# Patient Record
Sex: Female | Born: 1945 | Race: White | Hispanic: No | Marital: Single | Smoking: Former smoker
Health system: Southern US, Community
[De-identification: ages and names within clinical notes are randomized; demographics above are authoritative.]

## PROBLEM LIST (undated history)

## (undated) DIAGNOSIS — I10 Essential (primary) hypertension: Secondary | ICD-10-CM

## (undated) DIAGNOSIS — J45909 Unspecified asthma, uncomplicated: Secondary | ICD-10-CM

## (undated) DIAGNOSIS — M359 Systemic involvement of connective tissue, unspecified: Secondary | ICD-10-CM

## (undated) HISTORY — PX: EYE SURGERY: SHX253

## (undated) HISTORY — PX: NOSE SURGERY: SHX723

## (undated) HISTORY — PX: ABDOMINAL HYSTERECTOMY: SHX81

---

## 2004-02-28 ENCOUNTER — Emergency Department: Payer: Self-pay | Admitting: Emergency Medicine

## 2004-03-17 ENCOUNTER — Ambulatory Visit: Payer: Self-pay | Admitting: Urology

## 2004-06-20 ENCOUNTER — Ambulatory Visit: Payer: Self-pay | Admitting: Internal Medicine

## 2014-08-27 DIAGNOSIS — Z8639 Personal history of other endocrine, nutritional and metabolic disease: Secondary | ICD-10-CM | POA: Insufficient documentation

## 2014-08-27 DIAGNOSIS — K5732 Diverticulitis of large intestine without perforation or abscess without bleeding: Secondary | ICD-10-CM | POA: Insufficient documentation

## 2014-08-27 DIAGNOSIS — I10 Essential (primary) hypertension: Secondary | ICD-10-CM | POA: Insufficient documentation

## 2014-08-27 DIAGNOSIS — J4520 Mild intermittent asthma, uncomplicated: Secondary | ICD-10-CM | POA: Insufficient documentation

## 2014-08-27 DIAGNOSIS — N200 Calculus of kidney: Secondary | ICD-10-CM | POA: Insufficient documentation

## 2014-08-27 DIAGNOSIS — M0609 Rheumatoid arthritis without rheumatoid factor, multiple sites: Secondary | ICD-10-CM | POA: Insufficient documentation

## 2014-08-28 ENCOUNTER — Other Ambulatory Visit: Payer: Self-pay | Admitting: Internal Medicine

## 2014-08-28 DIAGNOSIS — R102 Pelvic and perineal pain: Secondary | ICD-10-CM

## 2014-08-28 DIAGNOSIS — R1032 Left lower quadrant pain: Secondary | ICD-10-CM

## 2014-08-30 ENCOUNTER — Ambulatory Visit
Admission: RE | Admit: 2014-08-30 | Discharge: 2014-08-30 | Disposition: A | Discharge status: Home / Self Care | Payer: Commercial Managed Care - PPO | Source: Ambulatory Visit | Attending: Internal Medicine | Admitting: Internal Medicine

## 2014-08-30 DIAGNOSIS — N281 Cyst of kidney, acquired: Secondary | ICD-10-CM | POA: Diagnosis not present

## 2014-08-30 DIAGNOSIS — K5730 Diverticulosis of large intestine without perforation or abscess without bleeding: Secondary | ICD-10-CM | POA: Insufficient documentation

## 2014-08-30 DIAGNOSIS — R1032 Left lower quadrant pain: Secondary | ICD-10-CM | POA: Diagnosis present

## 2014-08-30 DIAGNOSIS — R102 Pelvic and perineal pain: Secondary | ICD-10-CM

## 2014-08-30 HISTORY — DX: Systemic involvement of connective tissue, unspecified: M35.9

## 2014-08-30 HISTORY — DX: Unspecified asthma, uncomplicated: J45.909

## 2014-08-30 HISTORY — DX: Essential (primary) hypertension: I10

## 2014-08-30 MED ORDER — IOHEXOL 300 MG/ML  SOLN
100.0000 mL | Freq: Once | INTRAMUSCULAR | Status: AC | PRN
  Administered 2014-08-30: 100 mL via INTRAVENOUS

## 2014-09-21 ENCOUNTER — Other Ambulatory Visit: Payer: Self-pay | Admitting: Internal Medicine

## 2014-09-21 DIAGNOSIS — Z1231 Encounter for screening mammogram for malignant neoplasm of breast: Secondary | ICD-10-CM

## 2014-09-26 ENCOUNTER — Ambulatory Visit
Admission: RE | Admit: 2014-09-26 | Discharge: 2014-09-26 | Disposition: A | Discharge status: Home / Self Care | Payer: Commercial Managed Care - PPO | Source: Ambulatory Visit | Attending: Internal Medicine | Admitting: Internal Medicine

## 2014-09-26 DIAGNOSIS — Z1231 Encounter for screening mammogram for malignant neoplasm of breast: Secondary | ICD-10-CM | POA: Diagnosis present

## 2015-05-01 DIAGNOSIS — E559 Vitamin D deficiency, unspecified: Secondary | ICD-10-CM | POA: Insufficient documentation

## 2015-09-02 ENCOUNTER — Other Ambulatory Visit: Payer: Self-pay | Admitting: Internal Medicine

## 2015-09-02 DIAGNOSIS — Z1231 Encounter for screening mammogram for malignant neoplasm of breast: Secondary | ICD-10-CM

## 2015-09-27 ENCOUNTER — Ambulatory Visit
Admission: RE | Admit: 2015-09-27 | Discharge: 2015-09-27 | Disposition: A | Discharge status: Home / Self Care | Payer: Medicare Other | Source: Ambulatory Visit | Attending: Internal Medicine | Admitting: Internal Medicine

## 2015-09-27 ENCOUNTER — Other Ambulatory Visit: Payer: Self-pay | Admitting: Internal Medicine

## 2015-09-27 DIAGNOSIS — Z1231 Encounter for screening mammogram for malignant neoplasm of breast: Secondary | ICD-10-CM | POA: Insufficient documentation

## 2016-08-18 ENCOUNTER — Other Ambulatory Visit: Payer: Self-pay | Admitting: Internal Medicine

## 2016-08-18 DIAGNOSIS — Z1231 Encounter for screening mammogram for malignant neoplasm of breast: Secondary | ICD-10-CM

## 2016-09-29 ENCOUNTER — Ambulatory Visit
Admission: RE | Admit: 2016-09-29 | Discharge: 2016-09-29 | Disposition: A | Discharge status: Home / Self Care | Payer: Medicare Other | Source: Ambulatory Visit | Attending: Internal Medicine | Admitting: Internal Medicine

## 2016-09-29 DIAGNOSIS — Z1231 Encounter for screening mammogram for malignant neoplasm of breast: Secondary | ICD-10-CM | POA: Insufficient documentation

## 2017-08-30 ENCOUNTER — Other Ambulatory Visit: Payer: Self-pay | Admitting: Internal Medicine

## 2017-08-30 DIAGNOSIS — Z1231 Encounter for screening mammogram for malignant neoplasm of breast: Secondary | ICD-10-CM

## 2017-09-30 ENCOUNTER — Ambulatory Visit
Admission: RE | Admit: 2017-09-30 | Discharge: 2017-09-30 | Disposition: A | Discharge status: Home / Self Care | Payer: Medicare Other | Source: Ambulatory Visit | Attending: Internal Medicine | Admitting: Internal Medicine

## 2017-09-30 DIAGNOSIS — Z1231 Encounter for screening mammogram for malignant neoplasm of breast: Secondary | ICD-10-CM | POA: Diagnosis present

## 2018-08-26 ENCOUNTER — Other Ambulatory Visit: Payer: Self-pay | Admitting: Internal Medicine

## 2018-08-26 DIAGNOSIS — Z1231 Encounter for screening mammogram for malignant neoplasm of breast: Secondary | ICD-10-CM

## 2018-10-03 ENCOUNTER — Ambulatory Visit
Admission: RE | Admit: 2018-10-03 | Discharge: 2018-10-03 | Disposition: A | Discharge status: Home / Self Care | Payer: Medicare Other | Source: Ambulatory Visit | Attending: Internal Medicine | Admitting: Internal Medicine

## 2018-10-03 DIAGNOSIS — Z1231 Encounter for screening mammogram for malignant neoplasm of breast: Secondary | ICD-10-CM | POA: Insufficient documentation

## 2019-02-17 ENCOUNTER — Ambulatory Visit: Payer: Commercial Managed Care - PPO | Attending: Internal Medicine

## 2019-02-17 DIAGNOSIS — Z23 Encounter for immunization: Secondary | ICD-10-CM | POA: Insufficient documentation

## 2019-02-17 NOTE — Progress Notes (Signed)
   Covid-19 Vaccination Clinic  Name:  Kathryn Hansen    MRN: [id number] DOB: 10/21/1945  02/17/2019  Kathryn Hansen was observed post Covid-19 immunization for 15 minutes without incidence. She was provided with Vaccine Information Sheet and instruction to access the V-Safe system.   Kathryn Hansen was instructed to call 911 with any severe reactions post vaccine: Marland Kitchen Difficulty breathing  . Swelling of your face and throat  . A fast heartbeat  . A bad rash all over your body  . Dizziness and weakness    Immunizations Administered    Name Date Dose VIS Date Route   Pfizer COVID-19 Vaccine 02/17/2019  8:55 AM 0.3 mL 12/16/2018 Intramuscular   Manufacturer: [place]   Lot: [id number]   [place]: [id number]

## 2019-03-14 ENCOUNTER — Ambulatory Visit: Payer: Commercial Managed Care - PPO | Attending: Internal Medicine

## 2019-03-14 DIAGNOSIS — Z23 Encounter for immunization: Secondary | ICD-10-CM | POA: Insufficient documentation

## 2019-03-14 NOTE — Progress Notes (Signed)
   Covid-19 Vaccination Clinic  Name:  Kathryn Hansen    MRN: [id number] DOB: 02-06-1945  03/14/2019  [name] was observed post Covid-19 immunization for 30 minutes without incident. She was provided with Vaccine Information Sheet and instruction to access the V-Safe system.   [name] was instructed to call 911 with any severe reactions post vaccine: Marland Kitchen Difficulty breathing  . Swelling of face and throat  . A fast heartbeat  . A bad rash all over body  . Dizziness and weakness   Immunizations Administered    Name Date Dose VIS Date Route   Pfizer COVID-19 Vaccine 03/14/2019  8:28 AM 0.3 mL 12/16/2018 Intramuscular   Manufacturer: [place]   Lot: [id number]   [place]: [id number]

## 2019-05-02 ENCOUNTER — Other Ambulatory Visit: Payer: Self-pay

## 2019-05-03 ENCOUNTER — Telehealth (INDEPENDENT_AMBULATORY_CARE_PROVIDER_SITE_OTHER): Payer: Self-pay | Admitting: Gastroenterology

## 2019-05-03 ENCOUNTER — Other Ambulatory Visit: Payer: Self-pay

## 2019-05-03 DIAGNOSIS — Z1211 Encounter for screening for malignant neoplasm of colon: Secondary | ICD-10-CM

## 2019-05-03 NOTE — Progress Notes (Signed)
Gastroenterology Pre-Procedure Review  Request Date: Tuesday 06/06/19 Requesting Physician: Dr. Allen Norris  PATIENT REVIEW QUESTIONS: The patient responded to the following health history questions as indicated:    1. Are you having any GI issues? no 2. Do you have a personal history of Polyps? yes (10 years ago.  Colonscopy was done in [place] or [place]) 3. Do you have a family history of Colon Cancer or Polyps? no 4. Diabetes Mellitus? no 5. Joint replacements in the past 12 months?no 6. Major health problems in the past 3 months?no 7. Any artificial heart valves, MVP, or defibrillator?no    MEDICATIONS & ALLERGIES:    Patient reports the following regarding taking any anticoagulation/antiplatelet therapy:   Plavix, Coumadin, Eliquis, Xarelto, Lovenox, Pradaxa, Brilinta, or Effient? no Aspirin? no  Patient confirms/reports the following medications:  Current Outpatient Medications  Medication Sig Dispense Refill  . albuterol (VENTOLIN HFA) 108 (90 Base) MCG/ACT inhaler INHALE 2 INHALATIONS INTO THE LUNGS EVERY 6 HOURS AS NEEDED FOR WHEEZING    . allopurinol (ZYLOPRIM) 100 MG tablet Take 100 mg by mouth daily.    Marland Kitchen aspirin 81 MG EC tablet Take by mouth.    . Fluticasone-Salmeterol (ADVAIR) 250-50 MCG/DOSE AEPB Inhale into the lungs.    . hydrochlorothiazide (HYDRODIURIL) 25 MG tablet Take 25 mg by mouth daily.    . naproxen (NAPROSYN) 250 MG tablet Take by mouth.    . Cholecalciferol 25 MCG (1000 UT) tablet Take by mouth.    Grant Ruts INHUB 250-50 MCG/DOSE AEPB INHALE 1 INHALATION INTO THE LUNGS EVERY 12 HOURS     No current facility-administered medications for this visit.    Patient confirms/reports the following allergies:  Allergies  Allergen Reactions  . Codeine Anaphylaxis and Other (See Comments)    Pt states it made her feel like she was dying;    . Other Anaphylaxis  . Azithromycin Nausea Only  . Cefdinir Other (See Comments)  . Erythromycin Nausea Only  . Levofloxacin  Other (See Comments)    Pt states it made her feel drunk     No orders of the defined types were placed in this encounter.   AUTHORIZATION INFORMATION Primary Insurance: 1D#: Group #:  Secondary Insurance: 1D#: Group #:  SCHEDULE INFORMATION: Date: 06/06/19 Time: Location:[facility]

## 2019-07-26 ENCOUNTER — Other Ambulatory Visit: Payer: Self-pay | Admitting: Internal Medicine

## 2019-07-26 DIAGNOSIS — Z1231 Encounter for screening mammogram for malignant neoplasm of breast: Secondary | ICD-10-CM

## 2019-08-25 ENCOUNTER — Other Ambulatory Visit
Admission: RE | Admit: 2019-08-25 | Discharge: 2019-08-25 | Disposition: A | Discharge status: Home / Self Care | Payer: Medicare Other | Source: Ambulatory Visit | Attending: Gastroenterology | Admitting: Gastroenterology

## 2019-08-25 ENCOUNTER — Other Ambulatory Visit: Payer: Self-pay

## 2019-08-25 DIAGNOSIS — Z01812 Encounter for preprocedural laboratory examination: Secondary | ICD-10-CM | POA: Diagnosis present

## 2019-08-25 DIAGNOSIS — Z20822 Contact with and (suspected) exposure to covid-19: Secondary | ICD-10-CM | POA: Insufficient documentation

## 2019-08-25 LAB — SARS CORONAVIRUS 2 (TAT 6-24 HRS): SARS Coronavirus 2: NEGATIVE

## 2019-08-26 IMAGING — MG MM DIGITAL SCREENING BILAT W/ TOMO W/ CAD
6 of 10 series · 6 of 30 positions shown · non-contrast
Comparison: Previous exam(s).

CLINICAL DATA: Screening.

EXAM:
DIGITAL SCREENING BILATERAL MAMMOGRAM WITH TOMO AND CAD

[R MLO synth-2D (1 of 2)]
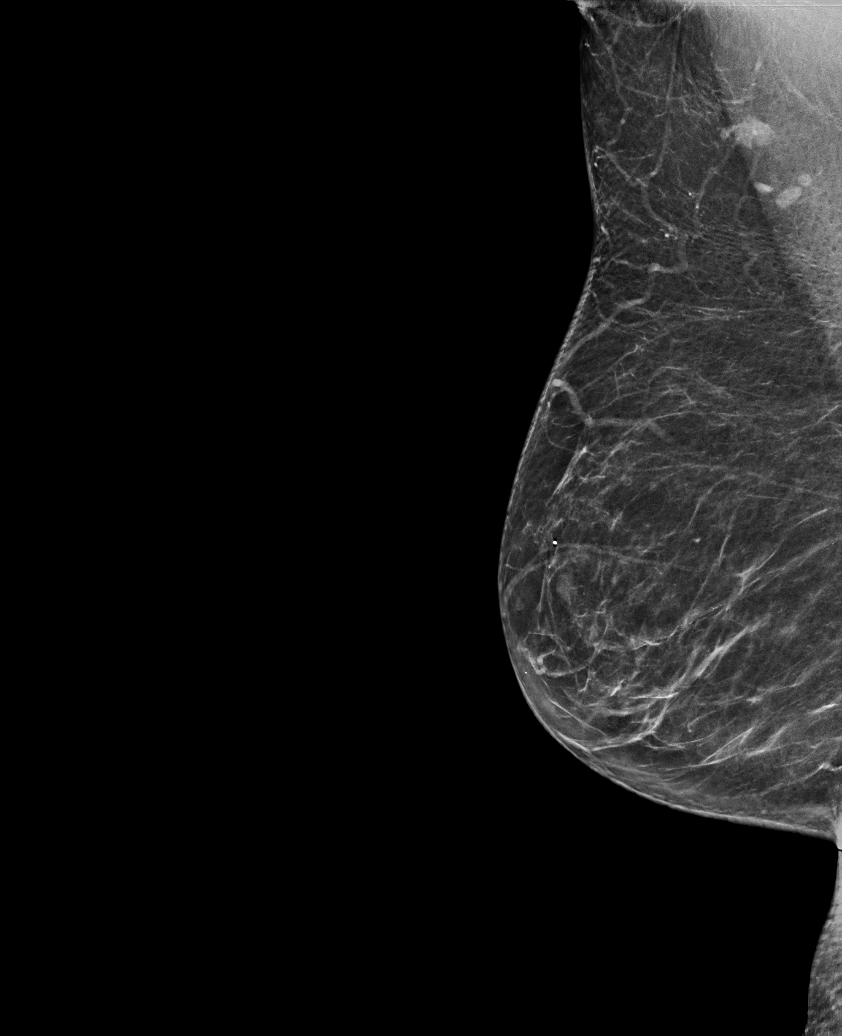

[R CC synth-2D]
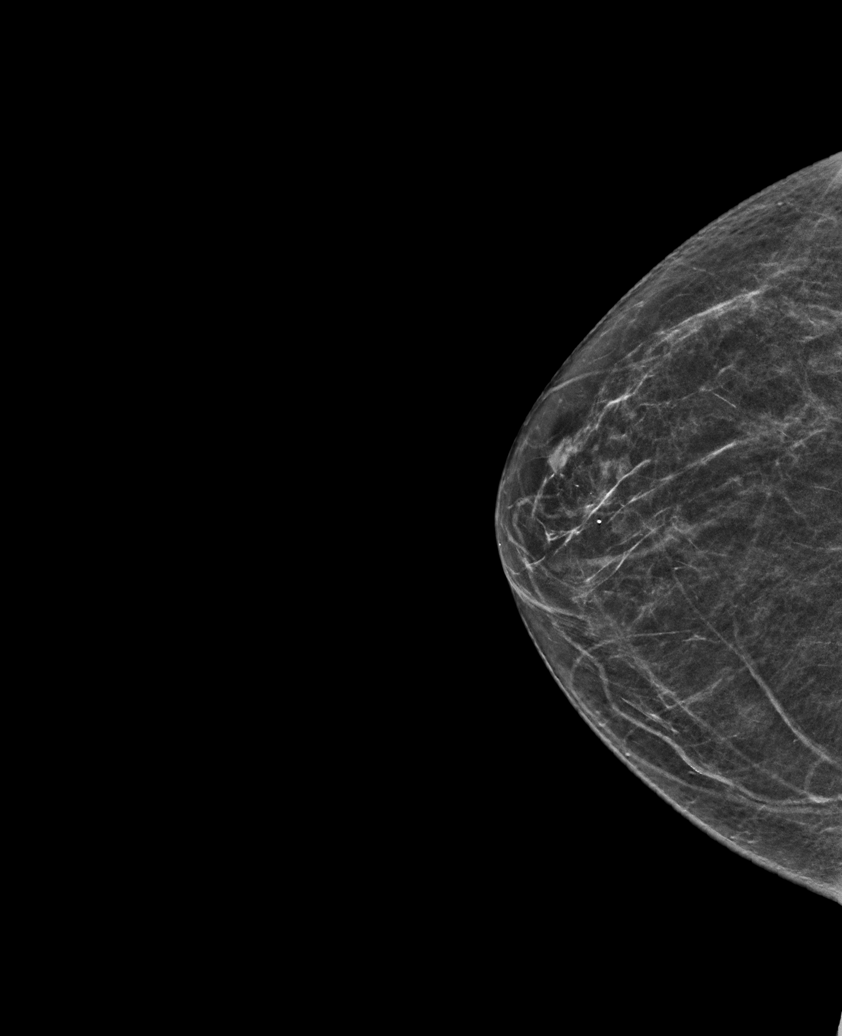

[L CC synth-2D]
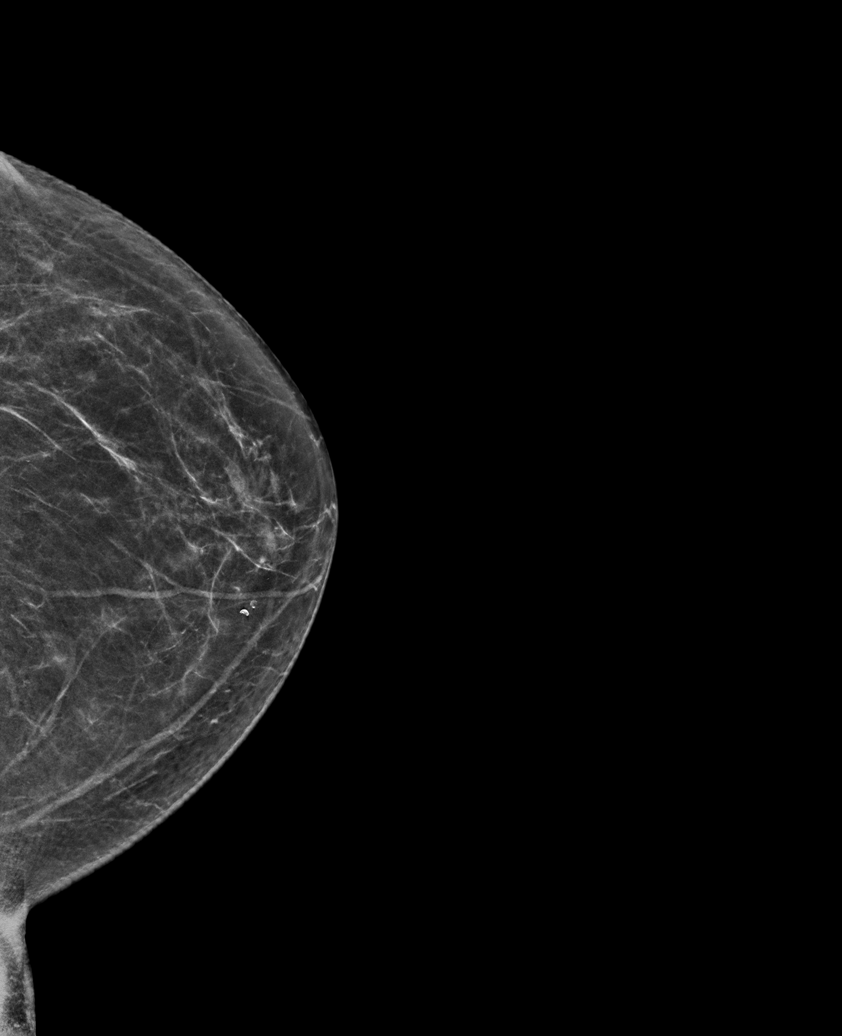

[L MLO synth-2D]
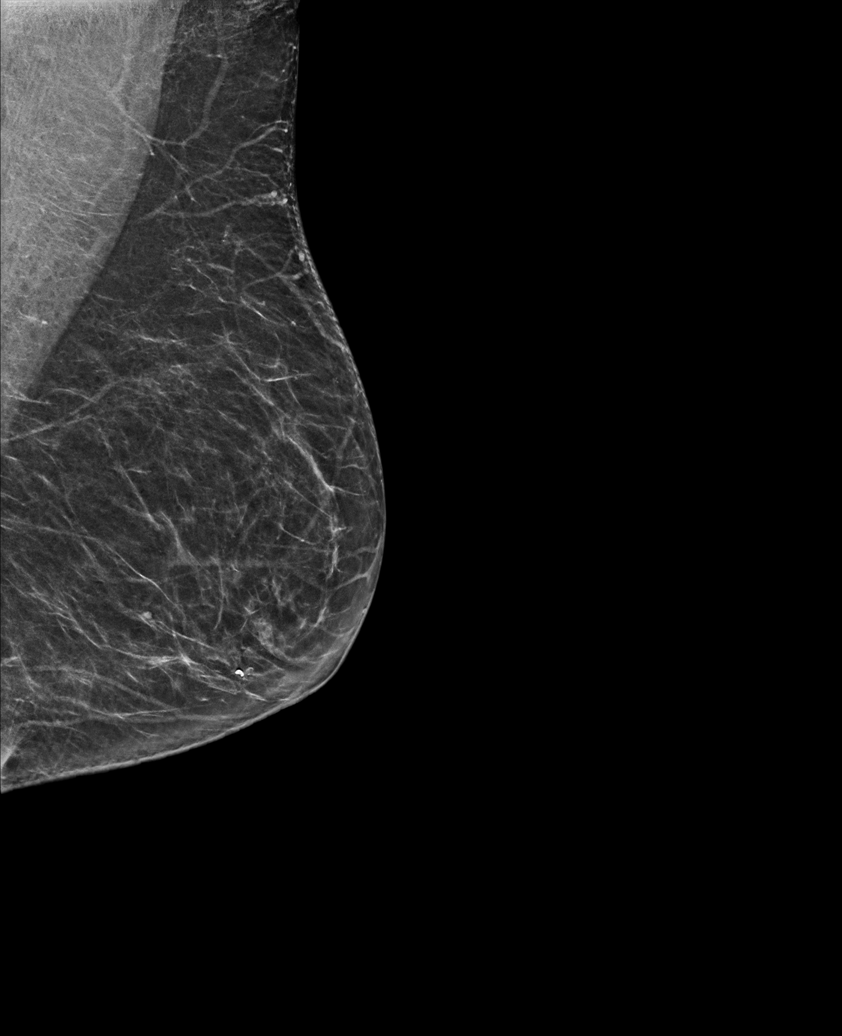

[R MLO synth-2D (2 of 2)]
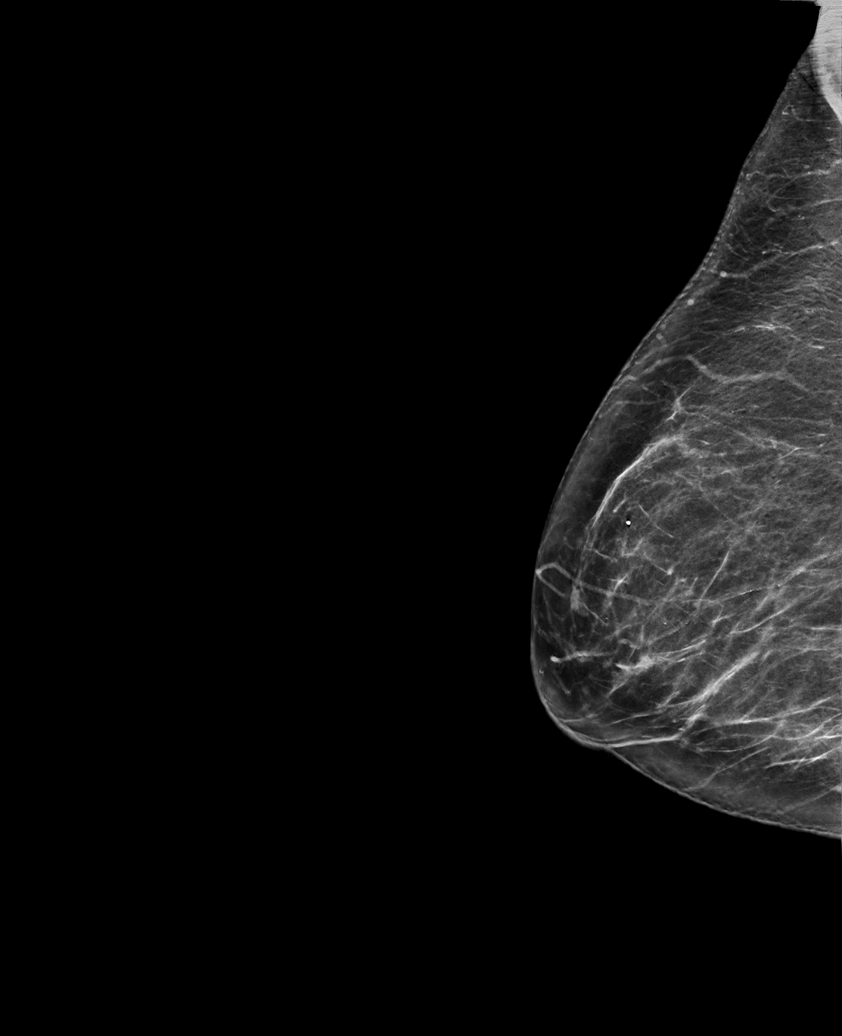

[R MLO tomo · tomo slice 36/71.0]
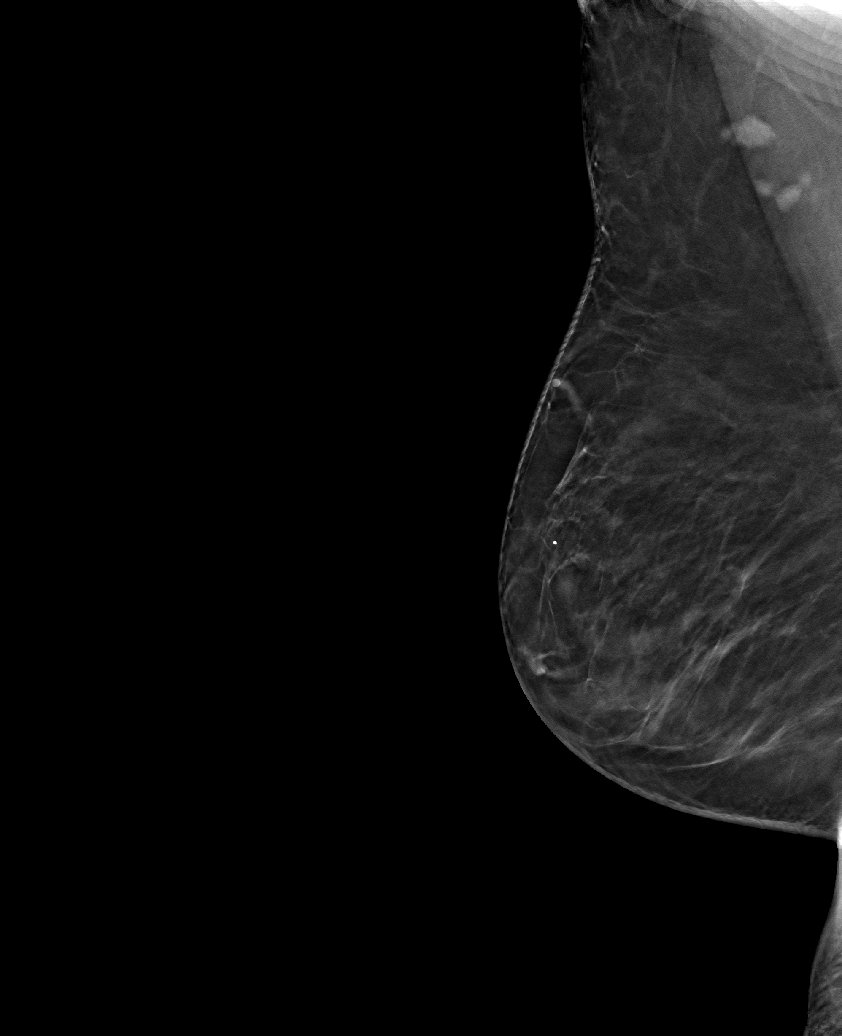

[6 of 30 positions shown; findings below may reference images not displayed]

ACR Breast Density Category b: There are scattered areas of
fibroglandular density.
FINDINGS: There are no findings suspicious for malignancy. Images were
processed with CAD.
IMPRESSION: No mammographic evidence of malignancy. A result letter of this
screening mammogram will be mailed directly to the patient.

RECOMMENDATION:
Screening mammogram in one year. (Code:[id number])

BI-RADS CATEGORY  1: Negative.

## 2019-08-29 ENCOUNTER — Ambulatory Visit: Payer: Medicare Other | Admitting: Certified Registered Nurse Anesthetist

## 2019-08-29 ENCOUNTER — Other Ambulatory Visit: Payer: Self-pay

## 2019-08-29 ENCOUNTER — Encounter: Payer: Self-pay | Admitting: Gastroenterology

## 2019-08-29 ENCOUNTER — Ambulatory Visit
Admission: RE | Admit: 2019-08-29 | Discharge: 2019-08-29 | Disposition: A | Discharge status: Home / Self Care | Payer: Medicare Other | Attending: Gastroenterology | Admitting: Gastroenterology

## 2019-08-29 ENCOUNTER — Encounter
Admission: RE | Disposition: A | Discharge status: Home / Self Care | Payer: Self-pay | Source: Home / Self Care | Attending: Gastroenterology

## 2019-08-29 DIAGNOSIS — Z79899 Other long term (current) drug therapy: Secondary | ICD-10-CM | POA: Diagnosis not present

## 2019-08-29 DIAGNOSIS — M359 Systemic involvement of connective tissue, unspecified: Secondary | ICD-10-CM | POA: Insufficient documentation

## 2019-08-29 DIAGNOSIS — K635 Polyp of colon: Secondary | ICD-10-CM | POA: Diagnosis not present

## 2019-08-29 DIAGNOSIS — Z1211 Encounter for screening for malignant neoplasm of colon: Secondary | ICD-10-CM

## 2019-08-29 DIAGNOSIS — J449 Chronic obstructive pulmonary disease, unspecified: Secondary | ICD-10-CM | POA: Insufficient documentation

## 2019-08-29 DIAGNOSIS — K640 First degree hemorrhoids: Secondary | ICD-10-CM | POA: Insufficient documentation

## 2019-08-29 DIAGNOSIS — Z90710 Acquired absence of both cervix and uterus: Secondary | ICD-10-CM | POA: Insufficient documentation

## 2019-08-29 DIAGNOSIS — K5730 Diverticulosis of large intestine without perforation or abscess without bleeding: Secondary | ICD-10-CM | POA: Insufficient documentation

## 2019-08-29 DIAGNOSIS — Z7951 Long term (current) use of inhaled steroids: Secondary | ICD-10-CM | POA: Insufficient documentation

## 2019-08-29 DIAGNOSIS — Z885 Allergy status to narcotic agent status: Secondary | ICD-10-CM | POA: Diagnosis not present

## 2019-08-29 DIAGNOSIS — Z7982 Long term (current) use of aspirin: Secondary | ICD-10-CM | POA: Insufficient documentation

## 2019-08-29 DIAGNOSIS — D123 Benign neoplasm of transverse colon: Secondary | ICD-10-CM | POA: Diagnosis not present

## 2019-08-29 DIAGNOSIS — I10 Essential (primary) hypertension: Secondary | ICD-10-CM | POA: Insufficient documentation

## 2019-08-29 DIAGNOSIS — Z87891 Personal history of nicotine dependence: Secondary | ICD-10-CM | POA: Insufficient documentation

## 2019-08-29 DIAGNOSIS — Z87442 Personal history of urinary calculi: Secondary | ICD-10-CM | POA: Diagnosis not present

## 2019-08-29 DIAGNOSIS — Z881 Allergy status to other antibiotic agents status: Secondary | ICD-10-CM | POA: Insufficient documentation

## 2019-08-29 DIAGNOSIS — Z888 Allergy status to other drugs, medicaments and biological substances status: Secondary | ICD-10-CM | POA: Diagnosis not present

## 2019-08-29 HISTORY — PX: COLONOSCOPY WITH PROPOFOL: SHX5780

## 2019-08-29 SURGERY — COLONOSCOPY WITH PROPOFOL
Anesthesia: General

## 2019-08-29 MED ORDER — PROPOFOL 500 MG/50ML IV EMUL
INTRAVENOUS | Status: AC
  Filled 2019-08-29: qty 50

## 2019-08-29 MED ORDER — PROPOFOL 500 MG/50ML IV EMUL
INTRAVENOUS | Status: DC | PRN
  Administered 2019-08-29: 130 ug/kg/min via INTRAVENOUS

## 2019-08-29 MED ORDER — LIDOCAINE HCL (CARDIAC) PF 100 MG/5ML IV SOSY
PREFILLED_SYRINGE | INTRAVENOUS | Status: DC | PRN
  Administered 2019-08-29: 50 mg via INTRAVENOUS

## 2019-08-29 MED ORDER — LIDOCAINE HCL (PF) 2 % IJ SOLN
INTRAMUSCULAR | Status: AC
  Filled 2019-08-29: qty 5

## 2019-08-29 MED ORDER — SODIUM CHLORIDE 0.9 % IV SOLN
INTRAVENOUS | Status: DC
  Administered 2019-08-29: 1000 mL via INTRAVENOUS

## 2019-08-29 MED ORDER — PROPOFOL 10 MG/ML IV BOLUS
INTRAVENOUS | Status: DC | PRN
  Administered 2019-08-29: 80 mg via INTRAVENOUS
  Administered 2019-08-29: 19 mg via INTRAVENOUS

## 2019-08-29 MED ORDER — PHENYLEPHRINE HCL (PRESSORS) 10 MG/ML IV SOLN
INTRAVENOUS | Status: DC | PRN
  Administered 2019-08-29 (×2): 100 ug via INTRAVENOUS

## 2019-08-29 NOTE — Anesthesia Preprocedure Evaluation (Signed)
Anesthesia Evaluation  Patient identified by MRN, date of birth, ID band Patient awake    Reviewed: Allergy & Precautions, H&P , NPO status , Patient's Chart, lab work & pertinent test results, reviewed documented beta blocker date and time   History of Anesthesia Complications Negative for: history of anesthetic complications  Airway Mallampati: I  TM Distance: >3 FB Neck ROM: full    Dental  (+) Dental Advidsory Given, Caps, Missing, Teeth Intact   Pulmonary neg shortness of breath, asthma , neg sleep apnea, COPD, neg recent URI, former smoker,    Pulmonary exam normal breath sounds clear to auscultation       Cardiovascular Exercise Tolerance: Good hypertension, (-) angina(-) Past MI and (-) Cardiac Stents Normal cardiovascular exam(-) dysrhythmias (-) Valvular Problems/Murmurs Rhythm:regular Rate:Normal     Neuro/Psych negative neurological ROS  negative psych ROS   GI/Hepatic negative GI ROS, Neg liver ROS,   Endo/Other  negative endocrine ROS  Renal/GU Renal disease (kidney stones)  negative genitourinary   Musculoskeletal   Abdominal   Peds  Hematology negative hematology ROS (+)   Anesthesia Other Findings Past Medical History: No date: Asthma No date: Collagen vascular disease (HCC) No date: Hypertension   Reproductive/Obstetrics negative OB ROS                             Anesthesia Physical Anesthesia Plan  ASA: III  Anesthesia Plan: General   Post-op Pain Management:    Induction: Intravenous  PONV Risk Score and Plan: 3 and Propofol infusion and TIVA  Airway Management Planned: Natural Airway and Nasal Cannula  Additional Equipment:   Intra-op Plan:   Post-operative Plan:   Informed Consent: I have reviewed the patients History and Physical, chart, labs and discussed the procedure including the risks, benefits and alternatives for the proposed anesthesia with  the patient or authorized representative who has indicated his/her understanding and acceptance.     Dental Advisory Given  Plan Discussed with: Anesthesiologist, CRNA and Surgeon  Anesthesia Plan Comments:         Anesthesia Quick Evaluation

## 2019-08-29 NOTE — Anesthesia Postprocedure Evaluation (Signed)
Anesthesia Post Note  Patient: Kathryn Hansen  Procedure(s) Performed: COLONOSCOPY WITH PROPOFOL (N/A )  Patient location during evaluation: Endoscopy Anesthesia Type: General Level of consciousness: awake and alert Pain management: pain level controlled Vital Signs Assessment: post-procedure vital signs reviewed and stable Respiratory status: spontaneous breathing, nonlabored ventilation, respiratory function stable and patient connected to nasal cannula oxygen Cardiovascular status: blood pressure returned to baseline and stable Postop Assessment: no apparent nausea or vomiting Anesthetic complications: no   No complications documented.   Last Vitals:  Vitals:   08/29/19 0839 08/29/19 0849  BP: 126/75 129/67  Pulse: 81 76  Resp: 15 18  Temp:    SpO2: 97% 100%    Last Pain:  Vitals:   08/29/19 0859  TempSrc:   PainSc: 0-No pain                 Martha Clan

## 2019-08-29 NOTE — Op Note (Signed)
[facility] Patient Name: Kathryn Hansen Procedure Date: 08/29/2019 7:08 AM MRN: [id number] Account #: [id number] Date of Birth: 01/04/1946 Admit Type: Outpatient Age: 74 Room: [facility] ENDO ROOM 4 Gender: Female Note Status: Finalized Procedure:             Colonoscopy Indications:           Screening for colorectal malignant neoplasm Providers:             [name] MD, MD Referring MD:          Leonie [facility]. Doy Hutching, MD (Referring MD) Medicines:             Propofol per Anesthesia Complications:         No immediate complications. Procedure:             Pre-Anesthesia Assessment:                        - Prior to the procedure, a History and Physical was                         performed, and patient medications and allergies were                         reviewed. The patient's tolerance of previous                         anesthesia was also reviewed. The risks and benefits                         of the procedure and the sedation options and risks                         were discussed with the patient. All questions were                         answered, and informed consent was obtained. Prior                         Anticoagulants: The patient has taken no previous                         anticoagulant or antiplatelet agents. ASA Grade                         Assessment: II - A patient with mild systemic disease.                         After reviewing the risks and benefits, the patient                         was deemed in satisfactory condition to undergo the                         procedure.                        After obtaining informed consent, the colonoscope was  passed under direct vision. Throughout the procedure,                         the patient's blood pressure, pulse, and oxygen                         saturations were monitored continuously. The                         Colonoscope was introduced through  the anus and                         advanced to the the cecum, identified by appendiceal                         orifice and ileocecal valve. The colonoscopy was                         performed without difficulty. The patient tolerated                         the procedure well. The quality of the bowel                         preparation was excellent. Findings:      The perianal and digital rectal examinations were normal.      A 15 mm polyp was found in the proximal transverse colon. The polyp was       sessile. Preparations were made for mucosal resection. Chromoscopy with       indigo carmine was done to mark the borders of the lesion. Saline with       indigo carmine was injected to raise the lesion and see the boarders.       Snare mucosal resection was performed. Resection and retrieval were       complete. To close a defect after polypectomy, three hemostatic clips       were successfully placed (MR conditional). There was no bleeding at the       end of the procedure.      Multiple small-mouthed diverticula were found in the sigmoid colon.      Non-bleeding internal hemorrhoids were found during retroflexion. The       hemorrhoids were Grade I (internal hemorrhoids that do not prolapse). Impression:            - One 15 mm polyp in the proximal transverse colon,                         removed with mucosal resection. Resected and                         retrieved. Clips (MR conditional) were placed.                        - Diverticulosis in the sigmoid colon.                        - Non-bleeding internal hemorrhoids.                        - Mucosal  resection was performed. Resection and                         retrieval were complete. Recommendation:        - Discharge patient to home.                        - Resume previous diet.                        - Continue present medications.                        - Await pathology results. Procedure Code(s):     ---  Professional ---                        [phone], Colonoscopy, flexible; with endoscopic mucosal                         resection Diagnosis Code(s):     --- Professional ---                        Z12.11, Encounter for screening for malignant neoplasm                         of colon                        K63.5, Polyp of colon CPT copyright 2019 American Medical Association. All rights reserved. The codes documented in this report are preliminary and upon coder review may  be revised to meet current compliance requirements. [name] MD, MD 08/29/2019 8:26:21 AM This report has been signed electronically. Number of Addenda: 0 Note Initiated On: 08/29/2019 7:08 AM Scope Withdrawal Time: 0 hours 21 minutes 28 seconds  Total Procedure Duration: 0 hours 27 minutes 12 seconds  Estimated Blood Loss:  Estimated blood loss: none.      [facility]

## 2019-08-29 NOTE — H&P (Signed)
Kathryn Lame, MD [address] [place], [place] [address] Phone: [phone] Fax : [phone]  Primary Care Physician:  Idelle Crouch, MD Primary Gastroenterologist:  Dr. Allen Norris  Pre-Procedure History & Physical: HPI:  Kathryn Hansen is a 74 y.o. female is here for a screening colonoscopy.   Past Medical History:  Diagnosis Date  . Asthma   . Collagen vascular disease ([place])   . Hypertension     Past Surgical History:  Procedure Laterality Date  . ABDOMINAL HYSTERECTOMY    . EYE SURGERY    . NOSE SURGERY      Prior to Admission medications   Medication Sig Start Date End Date Taking? Authorizing Provider  allopurinol (ZYLOPRIM) 100 MG tablet Take 100 mg by mouth daily. 02/15/19  Yes [provider]  aspirin 81 MG EC tablet Take by mouth.   Yes [provider]  Cholecalciferol 25 MCG (1000 UT) tablet Take by mouth.   Yes [provider]  Fluticasone-Salmeterol (ADVAIR) 250-50 MCG/DOSE AEPB Inhale into the lungs. 10/27/18 10/27/19 Yes [provider]  hydrochlorothiazide (HYDRODIURIL) 25 MG tablet Take 25 mg by mouth daily. 03/24/19  Yes [provider]  Grant Ruts INHUB 250-50 MCG/DOSE AEPB INHALE 1 INHALATION INTO THE LUNGS EVERY 12 HOURS 02/10/19  Yes [provider]  albuterol (VENTOLIN HFA) 108 (90 Base) MCG/ACT inhaler INHALE 2 INHALATIONS INTO THE LUNGS EVERY 6 HOURS AS NEEDED FOR WHEEZING 02/15/19   [provider]  naproxen (NAPROSYN) 250 MG tablet Take by mouth. 10/27/18   [provider]    Allergies as of 05/03/2019 - Review Complete 05/03/2019  Allergen Reaction Noted  . Codeine Anaphylaxis and Other (See Comments) 09/27/2013  . Other Anaphylaxis 08/27/2014  . Azithromycin Nausea Only 08/27/2014  . Cefdinir Other (See Comments) 08/27/2014  . Erythromycin Nausea Only 08/27/2014  . Levofloxacin Other (See Comments) 09/27/2013    Family History  Problem Relation Age of Onset  .  Breast cancer Neg Hx     Social History   Socioeconomic History  . Marital status: Single    Spouse name: Not on file  . Number of children: Not on file  . Years of education: Not on file  . Highest education level: Not on file  Occupational History  . Not on file  Tobacco Use  . Smoking status: Former Research scientist (life sciences)  . Smokeless tobacco: Never Used  Vaping Use  . Vaping Use: Never used  Substance and Sexual Activity  . Alcohol use: Not Currently  . Drug use: Never  . Sexual activity: Not on file  Other Topics Concern  . Not on file  Social History Narrative  . Not on file   Social Determinants of Health   Financial Resource Strain:   . Difficulty of Paying Living Expenses: Not on file  Food Insecurity:   . Worried About Charity fundraiser in the Last Year: Not on file  . Ran Out of Food in the Last Year: Not on file  Transportation Needs:   . Lack of Transportation (Medical): Not on file  . Lack of Transportation (Non-Medical): Not on file  Physical Activity:   . Days of Exercise per Week: Not on file  . Minutes of Exercise per Session: Not on file  Stress:   . Feeling of Stress : Not on file  Social Connections:   . Frequency of Communication with Friends and Family: Not on file  . Frequency of Social Gatherings with Friends and Family: Not  on file  . Attends Religious Services: Not on file  . Active Member of Clubs or Organizations: Not on file  . Attends Archivist Meetings: Not on file  . Marital Status: Not on file  Intimate Partner Violence:   . Fear of Current or Ex-Partner: Not on file  . Emotionally Abused: Not on file  . Physically Abused: Not on file  . Sexually Abused: Not on file    Review of Systems: See HPI, otherwise negative ROS  Physical Exam: BP (!) 152/81   Pulse 99   Temp 97.9 F (36.6 C) (Temporal)   Resp 20   Ht 5\' 6"  (1.676 m)   Wt 75.4 kg   SpO2 94%   BMI 26.85 kg/m  General:   Alert,  pleasant and cooperative in  NAD Head:  Normocephalic and atraumatic. Neck:  Supple; no masses or thyromegaly. Lungs:  Clear throughout to auscultation.    Heart:  Regular rate and rhythm. Abdomen:  Soft, nontender and nondistended. Normal bowel sounds, without guarding, and without rebound.   Neurologic:  Alert and  oriented x4;  grossly normal neurologically.  Impression/Plan: Kathryn Hansen is now here to undergo a screening colonoscopy.  Risks, benefits, and alternatives regarding colonoscopy have been reviewed with the patient.  Questions have been answered.  All parties agreeable.

## 2019-08-29 NOTE — Transfer of Care (Signed)
Immediate Anesthesia Transfer of Care Note  Patient: Kathryn Hansen  Procedure(s) Performed: COLONOSCOPY WITH PROPOFOL (N/A )  Patient Location: PACU  Anesthesia Type:General  Level of Consciousness: drowsy  Airway & Oxygen Therapy: Patient Spontanous Breathing and Patient connected to nasal cannula oxygen  Post-op Assessment: Report given to RN and Post -op Vital signs reviewed and stable  Post vital signs: Reviewed and stable  Last Vitals:  Vitals Value Taken Time  BP 97/52 08/29/19 0828  Temp    Pulse 67 08/29/19 0829  Resp 12 08/29/19 0829  SpO2 93 % 08/29/19 0829  Vitals shown include unvalidated device data.  Last Pain:  Vitals:   08/29/19 0709  TempSrc: Temporal  PainSc: 0-No pain         Complications: No complications documented.

## 2019-08-30 ENCOUNTER — Encounter: Payer: Self-pay | Admitting: Gastroenterology

## 2019-08-30 LAB — SURGICAL PATHOLOGY

## 2019-08-30 NOTE — Progress Notes (Signed)
   08/29/19 0755  Clinical Encounter Type  Visited With Family  Visit Type Initial  Referral From Chaplain  Consult/Referral To Chaplain  While rounding SDS, chaplain briefly spoke with patient's friend and he said he was fine. He told chaplain to be blessed.

## 2019-08-31 ENCOUNTER — Encounter: Payer: Self-pay | Admitting: Gastroenterology

## 2019-10-04 ENCOUNTER — Ambulatory Visit
Admission: RE | Admit: 2019-10-04 | Discharge: 2019-10-04 | Disposition: A | Discharge status: Home / Self Care | Payer: Medicare Other | Source: Ambulatory Visit | Attending: Internal Medicine | Admitting: Internal Medicine

## 2019-10-04 DIAGNOSIS — Z1231 Encounter for screening mammogram for malignant neoplasm of breast: Secondary | ICD-10-CM | POA: Diagnosis not present

## 2020-05-09 ENCOUNTER — Other Ambulatory Visit: Payer: Self-pay | Admitting: Internal Medicine

## 2020-05-09 DIAGNOSIS — Z1231 Encounter for screening mammogram for malignant neoplasm of breast: Secondary | ICD-10-CM

## 2020-07-22 ENCOUNTER — Telehealth (INDEPENDENT_AMBULATORY_CARE_PROVIDER_SITE_OTHER): Payer: Self-pay | Admitting: Gastroenterology

## 2020-07-22 ENCOUNTER — Other Ambulatory Visit: Payer: Self-pay

## 2020-07-22 DIAGNOSIS — Z86010 Personal history of colonic polyps: Secondary | ICD-10-CM

## 2020-07-22 MED ORDER — NA SULFATE-K SULFATE-MG SULF 17.5-3.13-1.6 GM/177ML PO SOLN: 1.0000 | Freq: Once | ORAL | 0 refills | Status: AC

## 2020-07-22 NOTE — Progress Notes (Signed)
Gastroenterology Pre-Procedure Review  Request Date: 08/15/20 Requesting Physician: Dr. Allen Norris  PATIENT REVIEW QUESTIONS: The patient responded to the following health history questions as indicated:    1. Are you having any GI issues? no 2. Do you have a personal history of Polyps? yes (08/29/2019) 3. Do you have a family history of Colon Cancer or Polyps? yes (Aunt colon cancer) 4. Diabetes Mellitus? no 5. Joint replacements in the past 12 months?no 6. Major health problems in the past 3 months?no 7. Any artificial heart valves, MVP, or defibrillator?no    MEDICATIONS & ALLERGIES:    Patient reports the following regarding taking any anticoagulation/antiplatelet therapy:   Plavix, Coumadin, Eliquis, Xarelto, Lovenox, Pradaxa, Brilinta, or Effient? no Aspirin? yes (81 mg)  Patient confirms/reports the following medications:  Current Outpatient Medications  Medication Sig Dispense Refill   albuterol (VENTOLIN HFA) 108 (90 Base) MCG/ACT inhaler INHALE 2 INHALATIONS INTO THE LUNGS EVERY 6 HOURS AS NEEDED FOR WHEEZING     allopurinol (ZYLOPRIM) 100 MG tablet Take 100 mg by mouth daily.     aspirin 81 MG EC tablet Take by mouth.     Cholecalciferol 25 MCG (1000 UT) tablet Take by mouth.     Fluticasone-Salmeterol (ADVAIR) 250-50 MCG/DOSE AEPB Inhale into the lungs.     hydrochlorothiazide (HYDRODIURIL) 25 MG tablet Take 25 mg by mouth daily.     naproxen (NAPROSYN) 250 MG tablet Take by mouth.     WIXELA INHUB 250-50 MCG/DOSE AEPB INHALE 1 INHALATION INTO THE LUNGS EVERY 12 HOURS     No current facility-administered medications for this visit.    Patient confirms/reports the following allergies:  Allergies  Allergen Reactions   Codeine Anaphylaxis and Other (See Comments)    Pt states it made her feel like she was dying;     Other Anaphylaxis   Azithromycin Nausea Only   Cefdinir Other (See Comments)   Erythromycin Nausea Only   Levofloxacin Other (See Comments)    Pt states  it made her feel drunk     No orders of the defined types were placed in this encounter.   AUTHORIZATION INFORMATION Primary Insurance: 1D#: Group #:  Secondary Insurance: 1D#: Group #:  SCHEDULE INFORMATION: Date: 08/15/20 Time: Location: [facility]

## 2020-08-07 ENCOUNTER — Encounter: Payer: Self-pay | Admitting: Gastroenterology

## 2020-08-15 ENCOUNTER — Ambulatory Visit: Payer: Medicare Other | Admitting: Anesthesiology

## 2020-08-15 ENCOUNTER — Other Ambulatory Visit: Payer: Self-pay

## 2020-08-15 ENCOUNTER — Encounter: Payer: Self-pay | Admitting: Gastroenterology

## 2020-08-15 ENCOUNTER — Ambulatory Visit
Admission: RE | Admit: 2020-08-15 | Discharge: 2020-08-15 | Disposition: A | Discharge status: Home / Self Care | Payer: Medicare Other | Attending: Gastroenterology | Admitting: Gastroenterology

## 2020-08-15 ENCOUNTER — Encounter
Admission: RE | Disposition: A | Discharge status: Home / Self Care | Payer: Self-pay | Source: Home / Self Care | Attending: Gastroenterology

## 2020-08-15 DIAGNOSIS — Z90710 Acquired absence of both cervix and uterus: Secondary | ICD-10-CM | POA: Diagnosis not present

## 2020-08-15 DIAGNOSIS — K5730 Diverticulosis of large intestine without perforation or abscess without bleeding: Secondary | ICD-10-CM | POA: Diagnosis not present

## 2020-08-15 DIAGNOSIS — Z7951 Long term (current) use of inhaled steroids: Secondary | ICD-10-CM | POA: Diagnosis not present

## 2020-08-15 DIAGNOSIS — Z885 Allergy status to narcotic agent status: Secondary | ICD-10-CM | POA: Diagnosis not present

## 2020-08-15 DIAGNOSIS — I10 Essential (primary) hypertension: Secondary | ICD-10-CM | POA: Diagnosis not present

## 2020-08-15 DIAGNOSIS — Z1211 Encounter for screening for malignant neoplasm of colon: Secondary | ICD-10-CM | POA: Insufficient documentation

## 2020-08-15 DIAGNOSIS — K648 Other hemorrhoids: Secondary | ICD-10-CM | POA: Insufficient documentation

## 2020-08-15 DIAGNOSIS — Z881 Allergy status to other antibiotic agents status: Secondary | ICD-10-CM | POA: Insufficient documentation

## 2020-08-15 DIAGNOSIS — Z7982 Long term (current) use of aspirin: Secondary | ICD-10-CM | POA: Diagnosis not present

## 2020-08-15 DIAGNOSIS — Z79899 Other long term (current) drug therapy: Secondary | ICD-10-CM | POA: Insufficient documentation

## 2020-08-15 DIAGNOSIS — Z860100 Personal history of colon polyps, unspecified: Secondary | ICD-10-CM

## 2020-08-15 DIAGNOSIS — Z87891 Personal history of nicotine dependence: Secondary | ICD-10-CM | POA: Insufficient documentation

## 2020-08-15 HISTORY — PX: COLONOSCOPY WITH PROPOFOL: SHX5780

## 2020-08-15 SURGERY — COLONOSCOPY WITH PROPOFOL
Anesthesia: General | Site: Rectum

## 2020-08-15 MED ORDER — STERILE WATER FOR IRRIGATION IR SOLN
Status: DC | PRN
  Administered 2020-08-15: .05 mL

## 2020-08-15 MED ORDER — LACTATED RINGERS IV SOLN: INTRAVENOUS | Status: DC

## 2020-08-15 MED ORDER — PROPOFOL 10 MG/ML IV BOLUS
INTRAVENOUS | Status: DC | PRN
  Administered 2020-08-15: 30 mg via INTRAVENOUS
  Administered 2020-08-15 (×2): 40 mg via INTRAVENOUS
  Administered 2020-08-15: 120 mg via INTRAVENOUS

## 2020-08-15 MED ORDER — LIDOCAINE HCL (CARDIAC) PF 100 MG/5ML IV SOSY
PREFILLED_SYRINGE | INTRAVENOUS | Status: DC | PRN
  Administered 2020-08-15: 30 mg via INTRAVENOUS

## 2020-08-15 SURGICAL SUPPLY — 6 items
GOWN CVR UNV OPN BCK APRN NK (MISCELLANEOUS) ×2 IMPLANT
GOWN ISOL THUMB LOOP REG UNIV (MISCELLANEOUS) ×4
KIT PRC NS LF DISP ENDO (KITS) ×1 IMPLANT
KIT PROCEDURE OLYMPUS (KITS) ×2
MANIFOLD NEPTUNE II (INSTRUMENTS) ×2 IMPLANT
WATER STERILE IRR 250ML POUR (IV SOLUTION) ×2 IMPLANT

## 2020-08-15 NOTE — Anesthesia Procedure Notes (Addendum)
Date/Time: 08/15/2020 8:05 AM Performed by: Cameron Ali, CRNA Pre-anesthesia Checklist: Patient identified, Emergency Drugs available, Suction available, Timeout performed and Patient being monitored Patient Re-evaluated:Patient Re-evaluated prior to induction Oxygen Delivery Method: Nasal cannula Placement Confirmation: positive ETCO2

## 2020-08-15 NOTE — Transfer of Care (Signed)
Immediate Anesthesia Transfer of Care Note  Patient: Kathryn Hansen  Procedure(s) Performed: COLONOSCOPY WITH PROPOFOL (Rectum)  Patient Location: PACU  Anesthesia Type: General  Level of Consciousness: awake, alert  and patient cooperative  Airway and Oxygen Therapy: Patient Spontanous Breathing and Patient connected to supplemental oxygen  Post-op Assessment: Post-op Vital signs reviewed, Patient's Cardiovascular Status Stable, Respiratory Function Stable, Patent Airway and No signs of Nausea or vomiting  Post-op Vital Signs: Reviewed and stable  Complications: No notable events documented.

## 2020-08-15 NOTE — Anesthesia Postprocedure Evaluation (Signed)
Anesthesia Post Note  Patient: Kathryn Hansen  Procedure(s) Performed: COLONOSCOPY WITH PROPOFOL (Rectum)     Patient location during evaluation: PACU Anesthesia Type: General Level of consciousness: awake and alert Pain management: pain level controlled Vital Signs Assessment: post-procedure vital signs reviewed and stable Respiratory status: spontaneous breathing, nonlabored ventilation, respiratory function stable and patient connected to nasal cannula oxygen Cardiovascular status: blood pressure returned to baseline and stable Postop Assessment: no apparent nausea or vomiting Anesthetic complications: no   No notable events documented.  Sinda Du

## 2020-08-15 NOTE — H&P (Signed)
Kathryn Lame, MD [address] [place], [place] [address] Phone:[phone] Fax : [phone]  Primary Care Physician:  Idelle Crouch, MD Primary Gastroenterologist:  Dr. Allen Norris  Pre-Procedure History & Physical: HPI:  Kathryn Hansen is a 75 y.o. female is here for an colonoscopy.   Past Medical History:  Diagnosis Date   Asthma    Collagen vascular disease ([place])    Hypertension     Past Surgical History:  Procedure Laterality Date   ABDOMINAL HYSTERECTOMY     COLONOSCOPY WITH PROPOFOL N/A 08/29/2019   Procedure: COLONOSCOPY WITH PROPOFOL;  Surgeon: Kathryn Lame, MD;  Location: [facility] ENDOSCOPY;  Service: Endoscopy;  Laterality: N/A;   EYE SURGERY     NOSE SURGERY      Prior to Admission medications   Medication Sig Start Date End Date Taking? Authorizing Provider  albuterol (VENTOLIN HFA) 108 (90 Base) MCG/ACT inhaler INHALE 2 INHALATIONS INTO THE LUNGS EVERY 6 HOURS AS NEEDED FOR WHEEZING 02/15/19  Yes [provider]  allopurinol (ZYLOPRIM) 100 MG tablet Take 100 mg by mouth daily. 02/15/19  Yes [provider]  aspirin 81 MG EC tablet Take by mouth.   Yes [provider]  Cholecalciferol 25 MCG (1000 UT) tablet Take by mouth.   Yes [provider]  Fluticasone-Salmeterol (ADVAIR) 250-50 MCG/DOSE AEPB Inhale into the lungs. 10/27/18 08/07/20 Yes [provider]  hydrochlorothiazide (HYDRODIURIL) 25 MG tablet Take 25 mg by mouth daily. 03/24/19  Yes [provider]  naproxen (NAPROSYN) 250 MG tablet Take by mouth. 10/27/18  Yes [provider]    Allergies as of 07/22/2020 - Review Complete 07/22/2020  Allergen Reaction Noted   Codeine Anaphylaxis and Other (See Comments) 09/27/2013   Other Anaphylaxis 08/27/2014   Azithromycin Nausea Only 08/27/2014   Cefdinir Other (See Comments) 08/27/2014   Erythromycin Nausea Only 08/27/2014   Levofloxacin Other (See Comments) 09/27/2013    Family History   Problem Relation Age of Onset   Breast cancer Neg Hx     Social History   Socioeconomic History   Marital status: Single    Spouse name: Not on file   Number of children: Not on file   Years of education: Not on file   Highest education level: Not on file  Occupational History   Not on file  Tobacco Use   Smoking status: Former   Smokeless tobacco: Never  Vaping Use   Vaping Use: Never used  Substance and Sexual Activity   Alcohol use: Not Currently   Drug use: Never   Sexual activity: Not on file  Other Topics Concern   Not on file  Social History Narrative   Not on file   Social Determinants of Health   Financial Resource Strain: Not on file  Food Insecurity: Not on file  Transportation Needs: Not on file  Physical Activity: Not on file  Stress: Not on file  Social Connections: Not on file  Intimate Partner Violence: Not on file    Review of Systems: See HPI, otherwise negative ROS  Physical Exam: BP (!) 145/78   Pulse (!) 103   Temp 97.6 F (36.4 C) (Temporal)   Ht '5\' 5"'$  (1.651 m)   Wt 76.7 kg   SpO2 95%   BMI 28.12 kg/m  General:   Alert,  pleasant and cooperative in NAD Head:  Normocephalic and atraumatic. Neck:  Supple; no masses or thyromegaly. Lungs:  Clear throughout to auscultation.    Heart:  Regular  rate and rhythm. Abdomen:  Soft, nontender and nondistended. Normal bowel sounds, without guarding, and without rebound.   Neurologic:  Alert and  oriented x4;  grossly normal neurologically.  Impression/Plan: [name] is here for an colonoscopy to be performed for a history of adenomatous polyps on 2021   Risks, benefits, limitations, and alternatives regarding  colonoscopy have been reviewed with the patient.  Questions have been answered.  All parties agreeable.   Kathryn Lame, MD  08/15/2020, 7:24 AM

## 2020-08-15 NOTE — Anesthesia Preprocedure Evaluation (Signed)
Anesthesia Evaluation  Patient identified by MRN, date of birth, ID band Patient awake    Reviewed: Allergy & Precautions, H&P , NPO status , Patient's Chart, lab work & pertinent test results, reviewed documented beta blocker date and time   History of Anesthesia Complications Negative for: history of anesthetic complications  Airway Mallampati: I  TM Distance: >3 FB Neck ROM: full    Dental  (+) Dental Advidsory Given, Caps, Missing, Teeth Intact   Pulmonary neg shortness of breath, asthma , neg sleep apnea, COPD, neg recent URI, former smoker,    Pulmonary exam normal breath sounds clear to auscultation       Cardiovascular Exercise Tolerance: Good hypertension, (-) angina(-) Past MI and (-) Cardiac Stents (-) dysrhythmias (-) Valvular Problems/Murmurs Rhythm:Regular Rate:Tachycardia  Occasional PVC, asymptomatic.   Neuro/Psych negative neurological ROS  negative psych ROS   GI/Hepatic negative GI ROS, Neg liver ROS,   Endo/Other  negative endocrine ROS  Renal/GU Renal disease (kidney stones)  negative genitourinary   Musculoskeletal  (+) Arthritis ,   Abdominal Normal abdominal exam  (+) - obese, scaphoid  Abdomen: soft.    Peds  Hematology negative hematology ROS (+)   Anesthesia Other Findings Past Medical History: No date: Asthma No date: Collagen vascular disease (HCC) No date: Hypertension   Reproductive/Obstetrics negative OB ROS                             Anesthesia Physical  Anesthesia Plan  ASA: III  Anesthesia Plan: General   Post-op Pain Management:    Induction: Intravenous  PONV Risk Score and Plan: 3 and Propofol infusion, TIVA and Treatment may vary due to age or medical condition  Airway Management Planned: Natural Airway and Nasal Cannula  Additional Equipment:   Intra-op Plan:   Post-operative Plan:   Informed Consent: I have reviewed the  patients History and Physical, chart, labs and discussed the procedure including the risks, benefits and alternatives for the proposed anesthesia with the patient or authorized representative who has indicated his/her understanding and acceptance.     Dental Advisory Given  Plan Discussed with: CRNA  Anesthesia Plan Comments:         Anesthesia Quick Evaluation Patient Active Problem List   Diagnosis Date Noted  . Encounter for screening colonoscopy   . Polyp of transverse colon   . Vitamin D deficiency 05/01/2015  . Bilateral kidney stones 08/27/2014  . Diverticulitis of large intestine without perforation or abscess without bleeding 08/27/2014  . History of vitamin D deficiency 08/27/2014  . HTN, goal below 140/90 08/27/2014  . Mild intermittent asthma without complication [id number]  . Rheumatoid arthritis of multiple sites with negative rheumatoid factor ([place]) 08/27/2014    No flowsheet data found. No flowsheet data found.  Risks and benefits of anesthesia discussed at length, patient or surrogate demonstrates understanding. Appropriately NPO. Plan to proceed with anesthesia.  Champ Mungo, MD 08/15/20

## 2020-08-15 NOTE — Op Note (Signed)
[facility] Patient Name: Kathryn Hansen Procedure Date: 08/15/2020 7:58 AM MRN: [id number] Account #: [id number] Date of Birth: Aug 29, 1945 Admit Type: Outpatient Age: 75 Room: [facility] OR ROOM 01 Gender: Female Note Status: Finalized Procedure:             Colonoscopy Indications:           High risk colon cancer surveillance: Personal history                         of colonic polyps Providers:             [name] MD, MD Referring MD:          Leonie Douglas. Doy Hutching, MD (Referring MD) Medicines:             Propofol per Anesthesia Complications:         No immediate complications. Procedure:             Pre-Anesthesia Assessment:                        - Prior to the procedure, a History and Physical was                         performed, and patient medications and allergies were                         reviewed. The patient's tolerance of previous                         anesthesia was also reviewed. The risks and benefits                         of the procedure and the sedation options and risks                         were discussed with the patient. All questions were                         answered, and informed consent was obtained. Prior                         Anticoagulants: The patient has taken no previous                         anticoagulant or antiplatelet agents. ASA Grade                         Assessment: II - A patient with mild systemic disease.                         After reviewing the risks and benefits, the patient                         was deemed in satisfactory condition to undergo the                         procedure.  After obtaining informed consent, the colonoscope was                         passed under direct vision. Throughout the procedure,                         the patient's blood pressure, pulse, and oxygen                         saturations were monitored continuously. The                          Colonoscope was introduced through the anus and                         advanced to the the cecum, identified by appendiceal                         orifice and ileocecal valve. The colonoscopy was                         performed without difficulty. The patient tolerated                         the procedure well. The quality of the bowel                         preparation was good. Findings:      The perianal and digital rectal examinations were normal.      Multiple small-mouthed diverticula were found in the entire colon.      Non-bleeding internal hemorrhoids were found during retroflexion. The       hemorrhoids were Grade II (internal hemorrhoids that prolapse but reduce       spontaneously). Impression:            - Diverticulosis in the entire examined colon.                        - Non-bleeding internal hemorrhoids.                        - No specimens collected. Recommendation:        - Discharge patient to home.                        - Resume previous diet.                        - Continue present medications.                        - Repeat colonoscopy in 5 years for surveillance. Procedure Code(s):     --- Professional ---                        [phone], Colonoscopy, flexible; diagnostic, including                         collection of specimen(s) by brushing or washing, when  performed (separate procedure) Diagnosis Code(s):     --- Professional ---                        Z86.010, Personal history of colonic polyps CPT copyright 2019 American Medical Association. All rights reserved. The codes documented in this report are preliminary and upon coder review may  be revised to meet current compliance requirements. [name] MD, MD 08/15/2020 8:19:13 AM This report has been signed electronically. Number of Addenda: 0 Note Initiated On: 08/15/2020 7:58 AM Scope Withdrawal Time: 0 hours 6 minutes 55 seconds  Total Procedure  Duration: 0 hours 10 minutes 19 seconds  Estimated Blood Loss:  Estimated blood loss: none. Estimated blood loss: none.      [facility]

## 2020-08-16 ENCOUNTER — Encounter: Payer: Self-pay | Admitting: Gastroenterology

## 2020-10-04 ENCOUNTER — Other Ambulatory Visit: Payer: Self-pay

## 2020-10-04 ENCOUNTER — Ambulatory Visit
Admission: RE | Admit: 2020-10-04 | Discharge: 2020-10-04 | Disposition: A | Discharge status: Home / Self Care | Payer: Medicare Other | Source: Ambulatory Visit | Attending: Internal Medicine | Admitting: Internal Medicine

## 2020-10-04 DIAGNOSIS — Z1231 Encounter for screening mammogram for malignant neoplasm of breast: Secondary | ICD-10-CM | POA: Insufficient documentation

## 2021-05-14 ENCOUNTER — Other Ambulatory Visit: Payer: Self-pay | Admitting: Internal Medicine

## 2021-05-14 DIAGNOSIS — Z1231 Encounter for screening mammogram for malignant neoplasm of breast: Secondary | ICD-10-CM

## 2021-06-13 ENCOUNTER — Ambulatory Visit
Admission: RE | Admit: 2021-06-13 | Discharge: 2021-06-13 | Disposition: A | Discharge status: Home / Self Care | Payer: Medicare Other | Source: Ambulatory Visit | Attending: Internal Medicine | Admitting: Internal Medicine

## 2021-06-13 DIAGNOSIS — Z1231 Encounter for screening mammogram for malignant neoplasm of breast: Secondary | ICD-10-CM | POA: Diagnosis present

## 2022-05-26 ENCOUNTER — Other Ambulatory Visit: Payer: Self-pay

## 2022-05-26 DIAGNOSIS — Z1231 Encounter for screening mammogram for malignant neoplasm of breast: Secondary | ICD-10-CM

## 2022-06-15 ENCOUNTER — Ambulatory Visit
Admission: RE | Admit: 2022-06-15 | Discharge: 2022-06-15 | Disposition: A | Discharge status: Home / Self Care | Payer: Medicare Other | Source: Ambulatory Visit | Attending: Internal Medicine | Admitting: Internal Medicine

## 2022-06-15 DIAGNOSIS — Z1231 Encounter for screening mammogram for malignant neoplasm of breast: Secondary | ICD-10-CM | POA: Diagnosis present

## 2022-12-26 ENCOUNTER — Emergency Department: Payer: Medicare Other

## 2022-12-26 ENCOUNTER — Encounter: Payer: Self-pay | Admitting: Emergency Medicine

## 2022-12-26 ENCOUNTER — Other Ambulatory Visit: Payer: Self-pay

## 2022-12-26 ENCOUNTER — Emergency Department
Admission: EM | Admit: 2022-12-26 | Discharge: 2022-12-26 | Disposition: A | Discharge status: Home / Self Care | Payer: Medicare Other | Attending: Emergency Medicine | Admitting: Emergency Medicine

## 2022-12-26 DIAGNOSIS — R0981 Nasal congestion: Secondary | ICD-10-CM | POA: Diagnosis present

## 2022-12-26 DIAGNOSIS — J208 Acute bronchitis due to other specified organisms: Secondary | ICD-10-CM

## 2022-12-26 DIAGNOSIS — I10 Essential (primary) hypertension: Secondary | ICD-10-CM | POA: Insufficient documentation

## 2022-12-26 DIAGNOSIS — J45909 Unspecified asthma, uncomplicated: Secondary | ICD-10-CM | POA: Diagnosis not present

## 2022-12-26 DIAGNOSIS — J209 Acute bronchitis, unspecified: Secondary | ICD-10-CM | POA: Insufficient documentation

## 2022-12-26 DIAGNOSIS — Z20822 Contact with and (suspected) exposure to covid-19: Secondary | ICD-10-CM | POA: Diagnosis not present

## 2022-12-26 LAB — SARS CORONAVIRUS 2 BY RT PCR: SARS Coronavirus 2 by RT PCR: NEGATIVE

## 2022-12-26 MED ORDER — ALBUTEROL SULFATE HFA 108 (90 BASE) MCG/ACT IN AERS: 2.0000 | INHALATION_SPRAY | RESPIRATORY_TRACT | 0 refills | Status: AC | PRN

## 2022-12-26 MED ORDER — ALBUTEROL SULFATE (2.5 MG/3ML) 0.083% IN NEBU
2.5000 mg | INHALATION_SOLUTION | Freq: Once | RESPIRATORY_TRACT | Status: AC
  Administered 2022-12-26: 2.5 mg via RESPIRATORY_TRACT
  Filled 2022-12-26: qty 3

## 2022-12-26 MED ORDER — PREDNISONE 20 MG PO TABS
40.0000 mg | ORAL_TABLET | Freq: Once | ORAL | Status: AC
  Administered 2022-12-26: 40 mg via ORAL
  Filled 2022-12-26: qty 2

## 2022-12-26 MED ORDER — ACETAMINOPHEN 325 MG PO TABS
650.0000 mg | ORAL_TABLET | Freq: Once | ORAL | Status: AC
  Administered 2022-12-26: 650 mg via ORAL
  Filled 2022-12-26: qty 2

## 2022-12-26 MED ORDER — PSEUDOEPHEDRINE HCL 60 MG PO TABS: 60.0000 mg | ORAL_TABLET | Freq: Four times a day (QID) | ORAL | 0 refills | Status: AC | PRN

## 2022-12-26 MED ORDER — PREDNISONE 50 MG PO TABS: 50.0000 mg | ORAL_TABLET | Freq: Every day | ORAL | 0 refills | Status: AC

## 2022-12-26 MED ORDER — PSEUDOEPHEDRINE HCL 30 MG PO TABS
60.0000 mg | ORAL_TABLET | Freq: Once | ORAL | Status: AC
  Administered 2022-12-26: 60 mg via ORAL
  Filled 2022-12-26 (×2): qty 2

## 2022-12-26 NOTE — ED Triage Notes (Signed)
Presents from home for SOB, runny nose, cough.  Positive covid test at home.  RA at baseline. 80s inlobby, on 2L [place] at this time.

## 2022-12-26 NOTE — ED Provider Notes (Signed)
[facility] Provider Note    Event Date/Time   First MD Initiated Contact with Patient 12/26/22 1152     (approximate)   History   Shortness of Breath   HPI  Kathryn Hansen is a 77 y.o. female with a history of asthma, collagen vascular disease, and hypertension who presents with rhinorrhea, nasal congestion, nonproductive cough, and slightly increased shortness of breath since yesterday evening.  The patient states that she took a home COVID test which was positive.  She was at the walk-in clinic and had an O2 saturation in the 80s so was sent to the ED.  The patient states she is feeling a bit better now.  She denies any vomiting or diarrhea.  She has no chest pain.  She does not feel dizzy or lightheaded.  I reviewed the past medical records.  The patient's most recent outpatient encounter prior to today was at the [place] clinic on 11/30 for UTI.   Physical Exam   Triage Vital Signs: ED Triage Vitals  Encounter Vitals Group     BP 12/26/22 0955 (!) 175/84     Systolic BP Percentile --      Diastolic BP Percentile --      Pulse Rate 12/26/22 0955 (!) 110     Resp 12/26/22 0955 (!) 26     Temp 12/26/22 0955 99.5 F (37.5 C)     Temp Source 12/26/22 0955 Oral     SpO2 12/26/22 0955 94 %     Weight 12/26/22 0956 181 lb (82.1 kg)     Height --      Head Circumference --      Peak Flow --      Pain Score 12/26/22 0955 0     Pain Loc --      Pain Education --      Exclude from Growth Chart --     Most recent vital signs: Vitals:   12/26/22 0955  BP: (!) 175/84  Pulse: (!) 110  Resp: (!) 26  Temp: 99.5 F (37.5 C)  SpO2: 94%    General: Alert, well-appearing, no distress.  CV:  Good peripheral perfusion.  Resp:  Normal effort.  Diminished breath sounds bilaterally with no significant wheezes or rales. Abd:  No distention.  Other:  No peripheral edema.   ED Results / Procedures / Treatments   Labs (all labs ordered are listed, but  only abnormal results are displayed) Labs Reviewed  SARS CORONAVIRUS 2 BY RT PCR     EKG  ED ECG REPORT I, Dionne Bucy, the attending physician, personally viewed and interpreted this ECG.  Date: 12/26/2022 EKG Time: 1004 Rate: 114 Rhythm: normal sinus rhythm QRS Axis: Left axis Intervals: normal ST/T Wave abnormalities: Nonspecific ST abnormality Narrative Interpretation: no evidence of acute ischemia; no prior EKG available for comparison    RADIOLOGY  Chest X-ray: I independently viewed and interpreted the images; there is no focal consolidation or edema   PROCEDURES:  Critical Care performed: No  Procedures   MEDICATIONS ORDERED IN ED: Medications  acetaminophen (TYLENOL) tablet 650 mg (650 mg Oral Given 12/26/22 1308)  pseudoephedrine (SUDAFED) tablet 60 mg (60 mg Oral Given 12/26/22 1308)  predniSONE (DELTASONE) tablet 40 mg (40 mg Oral Given 12/26/22 1308)  albuterol (PROVENTIL) (2.5 MG/3ML) 0.083% nebulizer solution 2.5 mg (2.5 mg Nebulization Given 12/26/22 1311)     IMPRESSION / MDM / ASSESSMENT AND PLAN / ED COURSE  I reviewed the triage vital signs  and the nursing notes.  77 year old female with PMH as noted above presents with rhinorrhea, nasal congestion, cough, and some shortness of breath since yesterday.  She was borderline hypoxic at the walk-in clinic and was sent to the ED.  Here, on my exam, O2 saturation is in the mid 90s on room air.  The patient does not have any increased work of breathing or respiratory distress.  She is borderline tachycardic with an elevated temperature.  Differential diagnosis includes, but is not limited to, COVID-19, influenza, RSV, other viral syndrome, bacterial pneumonia, acute bronchitis.  COVID PCR here is negative, the patient states that the rapid tests she had at home more several years old and expired muscle may not be accurate.  Chest x-ray shows no acute findings.  Patient's presentation is most  consistent with acute complicated illness / injury requiring diagnostic workup.  We will give bronchodilators, steroid, Sudafed, and acetaminophen and reassess.  ----------------------------------------- 1:42 PM on 12/26/2022 -----------------------------------------  The patient states she is feeling better.  O2 saturation remains in the low 90s on room air.  I counseled her on the results of the workup and on the likely etiology of her symptoms.  Overall presentation is consistent with an acute viral bronchitis.  Although her O2 saturation was apparently low earlier, it is adequate now.  The patient states she feels well and would like to go home.  She does not want to stay in the ED longer.  She is stable for discharge at this time.  I have prescribed albuterol, pseudoephedrine, and prednisone.  I gave strict return precautions and she expressed understanding.   FINAL CLINICAL IMPRESSION(S) / ED DIAGNOSES   Final diagnoses:  Acute viral bronchitis     Rx / DC Orders   ED Discharge Orders          Ordered    predniSONE (DELTASONE) 50 MG tablet  Daily        12/26/22 1336    albuterol (VENTOLIN HFA) 108 (90 Base) MCG/ACT inhaler  Every 4 hours PRN        12/26/22 1336    pseudoephedrine (SUDAFED) 60 MG tablet  Every 6 hours PRN        12/26/22 1336             Note:  This document was prepared using Dragon voice recognition software and may include unintentional dictation errors.    Dionne Bucy, MD 12/26/22 1343

## 2022-12-26 NOTE — Discharge Instructions (Addendum)
Use albuterol inhaler up to every 4 hours as needed for shortness of breath and take the prednisone daily starting tomorrow.  You may use the pseudoephedrine as needed for congestion or runny nose.  You may also take ibuprofen or Tylenol up to every 6 hours as needed for fever.  Follow-up with your primary care provider.  Return to the ER for new, worsening, or persistent severe shortness of breath, wheezing, fever, weakness, chest pain, low oxygen level, or any other new or worsening symptoms that concern you.

## 2022-12-26 NOTE — ED Triage Notes (Signed)
Pt in via [facility] with possible covid, and SOB. Pt was in 80's, and was placed on oxygen

## 2023-05-24 ENCOUNTER — Other Ambulatory Visit: Payer: Self-pay | Admitting: Internal Medicine

## 2023-05-24 DIAGNOSIS — Z1231 Encounter for screening mammogram for malignant neoplasm of breast: Secondary | ICD-10-CM

## 2023-06-16 ENCOUNTER — Ambulatory Visit
Admission: RE | Admit: 2023-06-16 | Discharge: 2023-06-16 | Disposition: A | Discharge status: Home / Self Care | Source: Ambulatory Visit | Attending: Internal Medicine | Admitting: Internal Medicine

## 2023-06-16 DIAGNOSIS — Z1231 Encounter for screening mammogram for malignant neoplasm of breast: Secondary | ICD-10-CM | POA: Insufficient documentation
